# Patient Record
Sex: Male | Born: 1990 | Race: White | Hispanic: No | Marital: Single | Smoking: Current every day smoker
Health system: Southern US, Community
[De-identification: ages and names within clinical notes are randomized; demographics above are authoritative.]

## PROBLEM LIST (undated history)

## (undated) DIAGNOSIS — S36113A Laceration of liver, unspecified degree, initial encounter: Secondary | ICD-10-CM

## (undated) DIAGNOSIS — N19 Unspecified kidney failure: Secondary | ICD-10-CM

## (undated) HISTORY — PX: COLOSTOMY REVERSAL: SHX5782

## (undated) HISTORY — PX: PELVIC FRACTURE SURGERY: SHX119

## (undated) HISTORY — PX: COLON SURGERY: SHX602

## (undated) HISTORY — PX: COLOSTOMY: SHX63

---

## 2013-09-02 ENCOUNTER — Emergency Department (HOSPITAL_BASED_OUTPATIENT_CLINIC_OR_DEPARTMENT_OTHER)
Admission: EM | Admit: 2013-09-02 | Discharge: 2013-09-02 | Disposition: A | Discharge status: Home / Self Care | Payer: Managed Care, Other (non HMO) | Attending: Emergency Medicine | Admitting: Emergency Medicine

## 2013-09-02 ENCOUNTER — Encounter (HOSPITAL_BASED_OUTPATIENT_CLINIC_OR_DEPARTMENT_OTHER): Payer: Self-pay | Admitting: Emergency Medicine

## 2013-09-02 DIAGNOSIS — IMO0002 Reserved for concepts with insufficient information to code with codable children: Secondary | ICD-10-CM | POA: Insufficient documentation

## 2013-09-02 DIAGNOSIS — Y833 Surgical operation with formation of external stoma as the cause of abnormal reaction of the patient, or of later complication, without mention of misadventure at the time of the procedure: Secondary | ICD-10-CM | POA: Insufficient documentation

## 2013-09-02 DIAGNOSIS — F17200 Nicotine dependence, unspecified, uncomplicated: Secondary | ICD-10-CM | POA: Insufficient documentation

## 2013-09-02 DIAGNOSIS — T888XXA Other specified complications of surgical and medical care, not elsewhere classified, initial encounter: Secondary | ICD-10-CM

## 2013-09-02 DIAGNOSIS — Z87448 Personal history of other diseases of urinary system: Secondary | ICD-10-CM | POA: Insufficient documentation

## 2013-09-02 HISTORY — DX: Unspecified kidney failure: N19

## 2013-09-02 HISTORY — DX: Laceration of liver, unspecified degree, initial encounter: S36.113A

## 2013-09-02 NOTE — ED Provider Notes (Signed)
  Medical screening examination/treatment/procedure(s) were performed by non-physician practitioner and as supervising physician I was immediately available for consultation/collaboration.   Gerhard Munch, MD 09/02/13 1538
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
## 2013-09-02 NOTE — ED Notes (Addendum)
Pt post op colostomy reversal approximately 3 weeks ago.  Pt continues to have bleeding at site.  Pt changes pads 2 x days.  Pt has wound to lower abdomen, noted small hematoma and bloody drainage to 4x4's.  No fever or pain, no issues with bms

## 2013-09-14 ENCOUNTER — Emergency Department (HOSPITAL_BASED_OUTPATIENT_CLINIC_OR_DEPARTMENT_OTHER): Payer: Managed Care, Other (non HMO)

## 2013-09-14 ENCOUNTER — Other Ambulatory Visit: Payer: Self-pay | Admitting: Radiology

## 2013-09-14 ENCOUNTER — Encounter (HOSPITAL_BASED_OUTPATIENT_CLINIC_OR_DEPARTMENT_OTHER): Payer: Self-pay | Admitting: *Deleted

## 2013-09-14 ENCOUNTER — Other Ambulatory Visit (INDEPENDENT_AMBULATORY_CARE_PROVIDER_SITE_OTHER): Payer: Self-pay | Admitting: General Surgery

## 2013-09-14 ENCOUNTER — Emergency Department (HOSPITAL_BASED_OUTPATIENT_CLINIC_OR_DEPARTMENT_OTHER)
Admission: EM | Admit: 2013-09-14 | Discharge: 2013-09-14 | Disposition: A | Discharge status: Home / Self Care | Payer: Managed Care, Other (non HMO) | Attending: Emergency Medicine | Admitting: Emergency Medicine

## 2013-09-14 DIAGNOSIS — Z87828 Personal history of other (healed) physical injury and trauma: Secondary | ICD-10-CM | POA: Insufficient documentation

## 2013-09-14 DIAGNOSIS — IMO0002 Reserved for concepts with insufficient information to code with codable children: Secondary | ICD-10-CM | POA: Insufficient documentation

## 2013-09-14 DIAGNOSIS — Z87448 Personal history of other diseases of urinary system: Secondary | ICD-10-CM | POA: Insufficient documentation

## 2013-09-14 DIAGNOSIS — F17200 Nicotine dependence, unspecified, uncomplicated: Secondary | ICD-10-CM | POA: Insufficient documentation

## 2013-09-14 DIAGNOSIS — Y838 Other surgical procedures as the cause of abnormal reaction of the patient, or of later complication, without mention of misadventure at the time of the procedure: Secondary | ICD-10-CM | POA: Insufficient documentation

## 2013-09-14 LAB — CBC WITH DIFFERENTIAL/PLATELET
Basophils Absolute: 0 10*3/uL (ref 0.0–0.1)
Eosinophils Absolute: 0.1 10*3/uL (ref 0.0–0.7)
Eosinophils Relative: 1 % (ref 0–5)
Lymphocytes Relative: 17 % (ref 12–46)
MCH: 28.8 pg (ref 26.0–34.0)
MCV: 89 fL (ref 78.0–100.0)
Platelets: 334 10*3/uL (ref 150–400)
RDW: 14.7 % (ref 11.5–15.5)
WBC: 11.9 10*3/uL — ABNORMAL HIGH (ref 4.0–10.5)

## 2013-09-14 LAB — BASIC METABOLIC PANEL
Calcium: 10.5 mg/dL (ref 8.4–10.5)
GFR calc Af Amer: >90 mL/min (ref >90)
GFR calc non Af Amer: 85 mL/min — ABNORMAL LOW (ref >90)
Sodium: 138 mEq/L (ref 135–145)

## 2013-09-14 MED ORDER — IOHEXOL 300 MG/ML  SOLN
100.0000 mL | Freq: Once | INTRAMUSCULAR | Status: AC | PRN
  Administered 2013-09-14: 100 mL via INTRAVENOUS

## 2013-09-14 MED ORDER — IOHEXOL 300 MG/ML  SOLN
50.0000 mL | Freq: Once | INTRAMUSCULAR | Status: AC | PRN
  Administered 2013-09-14: 50 mL via ORAL
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
## 2013-09-14 NOTE — ED Notes (Addendum)
Patient c/o hematoma since 9/3 mid lower abd, seen inER on 9/3. Started experiencing pain at area 3 days ago & some swelling, normal bowel movements
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
## 2013-09-15 ENCOUNTER — Encounter (HOSPITAL_COMMUNITY): Payer: Self-pay

## 2013-09-15 ENCOUNTER — Ambulatory Visit (HOSPITAL_COMMUNITY)
Admit: 2013-09-15 | Discharge: 2013-09-15 | Disposition: A | Discharge status: Home / Self Care | Payer: Managed Care, Other (non HMO) | Attending: General Surgery | Admitting: General Surgery

## 2013-09-15 ENCOUNTER — Other Ambulatory Visit (INDEPENDENT_AMBULATORY_CARE_PROVIDER_SITE_OTHER): Payer: Self-pay | Admitting: General Surgery

## 2013-09-15 LAB — CBC
Platelets: 417 10*3/uL — ABNORMAL HIGH (ref 150–400)
RBC: 3.94 MIL/uL — ABNORMAL LOW (ref 4.22–5.81)
WBC: 14.7 10*3/uL — ABNORMAL HIGH (ref 4.0–10.5)

## 2013-09-15 LAB — PROTIME-INR: Prothrombin Time: 13.4 seconds (ref 11.6–15.2)

## 2013-09-15 MED ORDER — FENTANYL CITRATE 0.05 MG/ML IJ SOLN
INTRAMUSCULAR | Status: AC
  Filled 2013-09-15: qty 6

## 2013-09-15 MED ORDER — FENTANYL CITRATE 0.05 MG/ML IJ SOLN
INTRAMUSCULAR | Status: AC | PRN
  Administered 2013-09-15: 50 ug via INTRAVENOUS
  Administered 2013-09-15 (×2): 100 ug via INTRAVENOUS
  Administered 2013-09-15 (×3): 50 ug via INTRAVENOUS

## 2013-09-15 MED ORDER — MIDAZOLAM HCL 2 MG/2ML IJ SOLN
INTRAMUSCULAR | Status: AC
  Filled 2013-09-15: qty 6

## 2013-09-15 MED ORDER — MIDAZOLAM HCL 2 MG/2ML IJ SOLN
INTRAMUSCULAR | Status: AC
  Filled 2013-09-15: qty 4

## 2013-09-15 MED ORDER — MIDAZOLAM HCL 2 MG/2ML IJ SOLN
INTRAMUSCULAR | Status: AC | PRN
  Administered 2013-09-15 (×7): 1 mg via INTRAVENOUS

## 2013-09-15 MED ORDER — SODIUM CHLORIDE 0.9 % IV SOLN
INTRAVENOUS | Status: DC
  Administered 2013-09-15: 08:00:00 via INTRAVENOUS

## 2013-09-15 MED ORDER — MIDAZOLAM HCL 2 MG/2ML IJ SOLN
INTRAMUSCULAR | Status: DC | PRN
  Administered 2013-09-15: 1 mg via INTRAVENOUS

## 2013-09-15 MED ORDER — FENTANYL CITRATE 0.05 MG/ML IJ SOLN
INTRAMUSCULAR | Status: AC
  Filled 2013-09-15: qty 4
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
## 2013-09-15 NOTE — Procedures (Signed)
Technically successful CT guided aspiration of large hematoma within the mid abdomen and pelvis with placement of 2 temporary drains. Approximately 180 cc of viscous/old blood was aspirated from the more superior drain. Approximately 30 cc of viscous/old blood was aspirated from the lower abdominal drain. Samples sent to laboratory.  No immediate post procedural complications.

## 2013-09-15 NOTE — Progress Notes (Addendum)
Patient arrived post procedure with 2 bulb drains. Dr. Grace Isaac removed drains at 1125. Combined output of 30 mL of blood. Patient arrived with dressing over hematoma on lower mid abdomen. Dressing was replaced by Barbee Cough. From radiology per Dr. Grace Isaac. Dressings clean, dry and intact. Vitals not obtained at 3rd 30 minute increment (1120) due to drain removal.

## 2013-09-15 NOTE — ED Provider Notes (Signed)
Medical screening examination/treatment/procedure(s) were performed by non-physician practitioner and as supervising physician I was immediately available for consultation/collaboration.   Loren Racer, MD 09/15/13 1048

## 2013-09-21 ENCOUNTER — Encounter (INDEPENDENT_AMBULATORY_CARE_PROVIDER_SITE_OTHER): Payer: Self-pay | Admitting: Surgery

## 2013-09-21 LAB — CULTURE, ROUTINE-ABSCESS

## 2013-10-23 IMAGING — CT CT PELVIS W/O CM
2 of 4 series · 16 of 46 positions shown, 18 images · non-contrast
Comparison: None.

CLINICAL DATA: History of colostomy reversal 08/07/2013. Recent
drainage tube removal. Abdominal pain and distention.

EXAM:
CT PELVIS WITHOUT CONTRAST
TECHNIQUE: Multidetector CT imaging of the pelvis was performed following the
standard protocol without intravenous contrast.

[Series 2: abd/pelvis 5.0 b31f · axial · 0.65mm/px · z∈[+1105,+1330]mm · 13 of 51 slices shown, 15 images]
[im 3/51  soft-tissue]
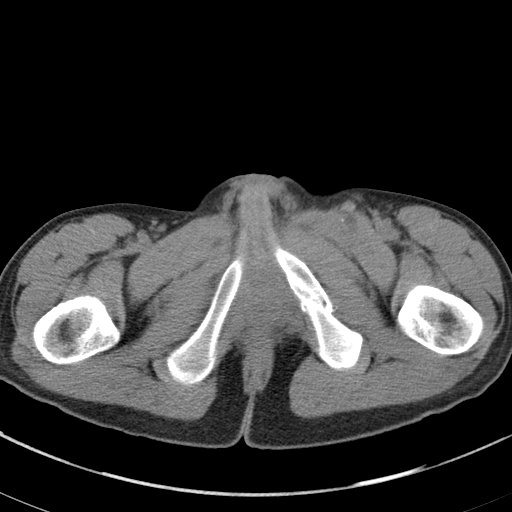
[im 3/51  bone]
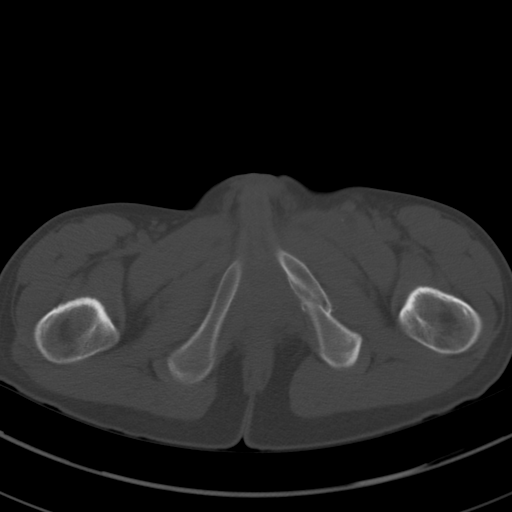
[im 7/51  soft-tissue]
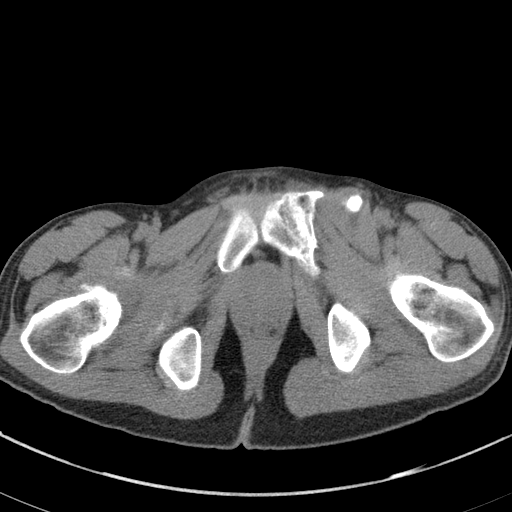
[im 11/51  soft-tissue]
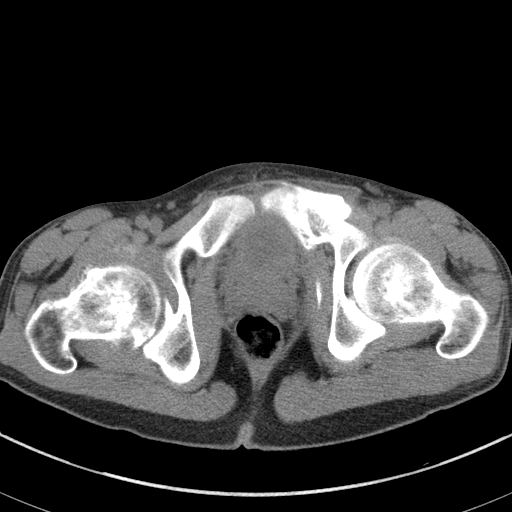
[im 15/51  soft-tissue]
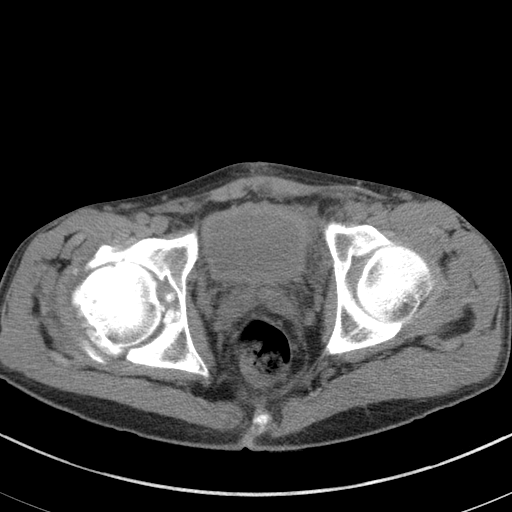
[im 17/51  soft-tissue]
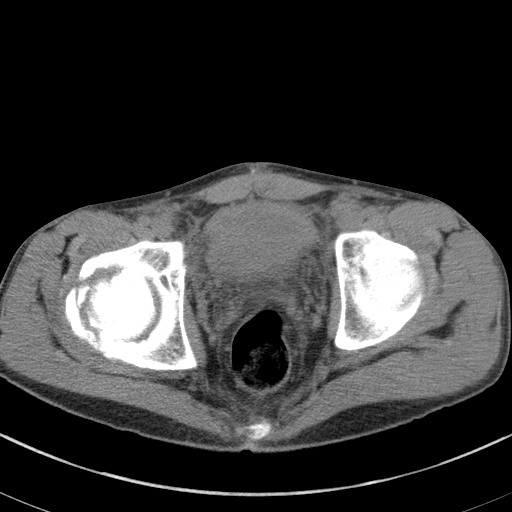
[im 21/51  soft-tissue]
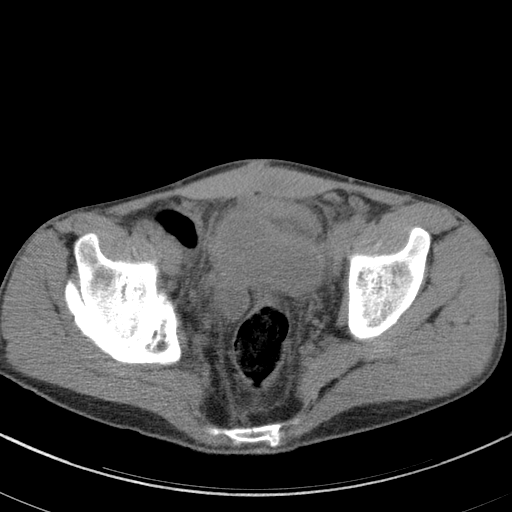
[im 26/51  soft-tissue]
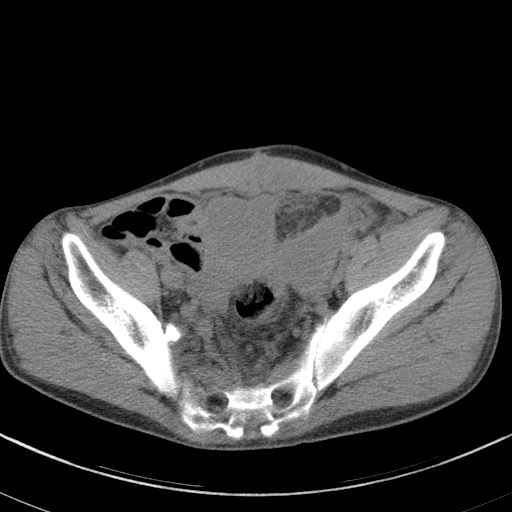
[im 30/51  soft-tissue]
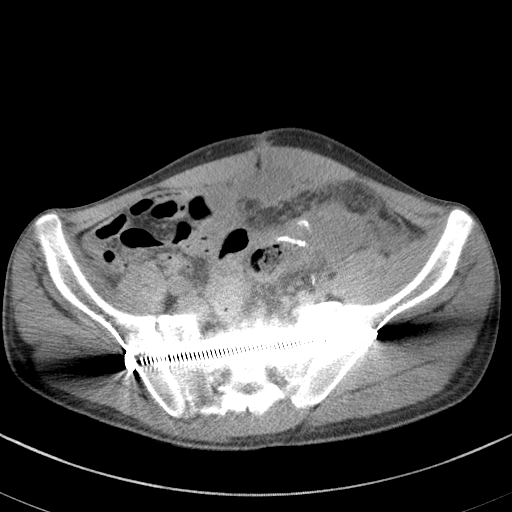
[im 34/51  soft-tissue]
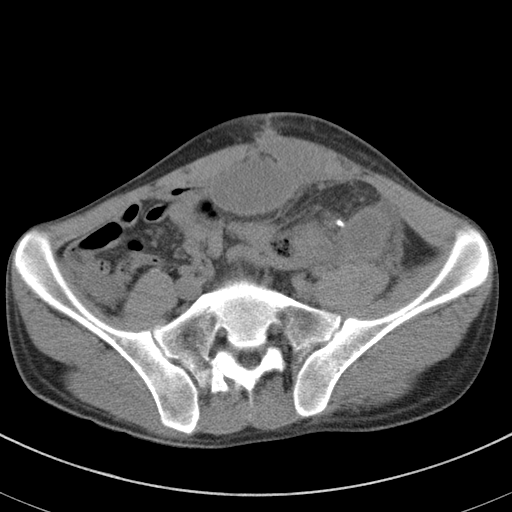
[im 34/51  bone]
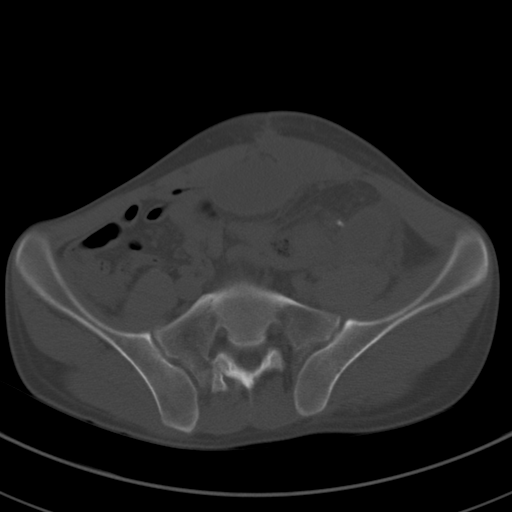
[im 36/51  soft-tissue]
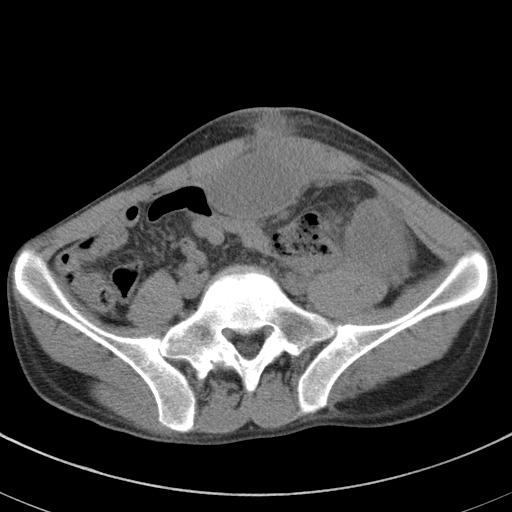
[im 40/51  soft-tissue]
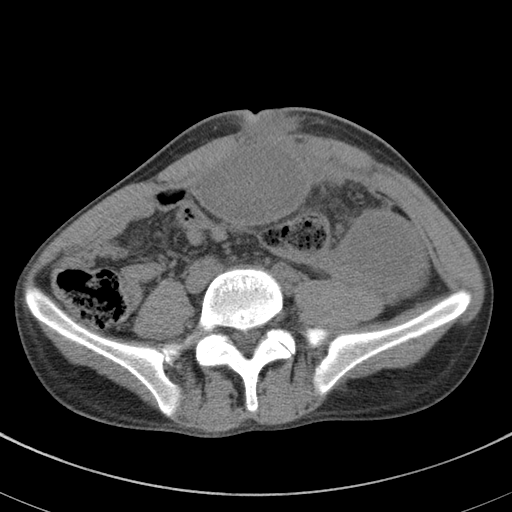
[im 44/51  soft-tissue]
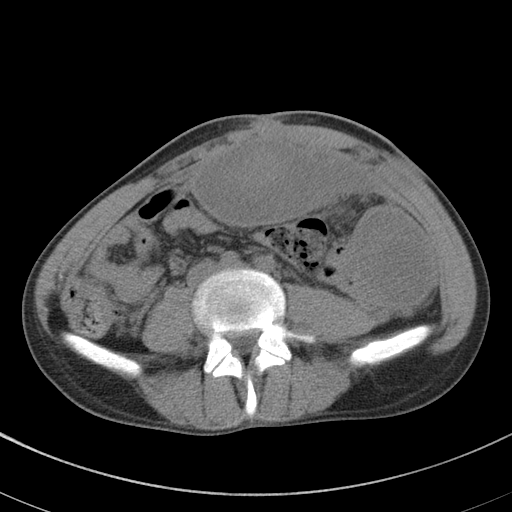
[im 48/51  soft-tissue]
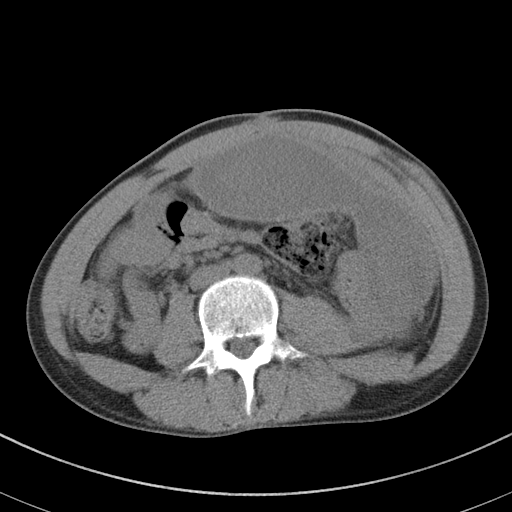

[Series 5: abd/pelvis 3.0 coronal · coronal · 0.50mm/px · 3 of 74 slices shown]
[im 25/74  soft-tissue]
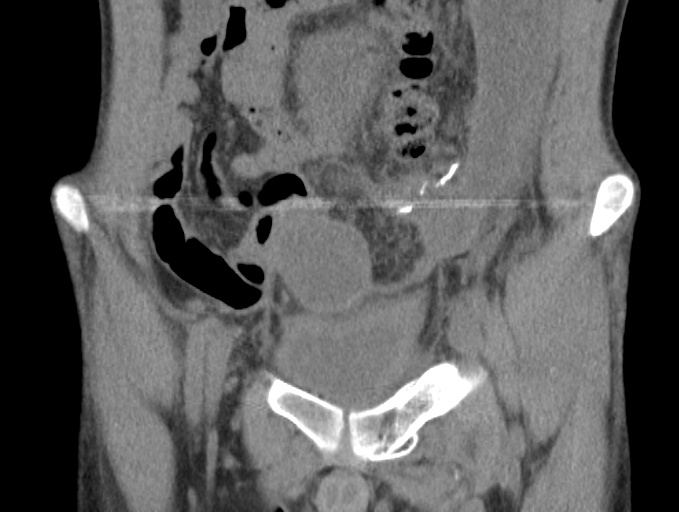
[im 33/74  soft-tissue]
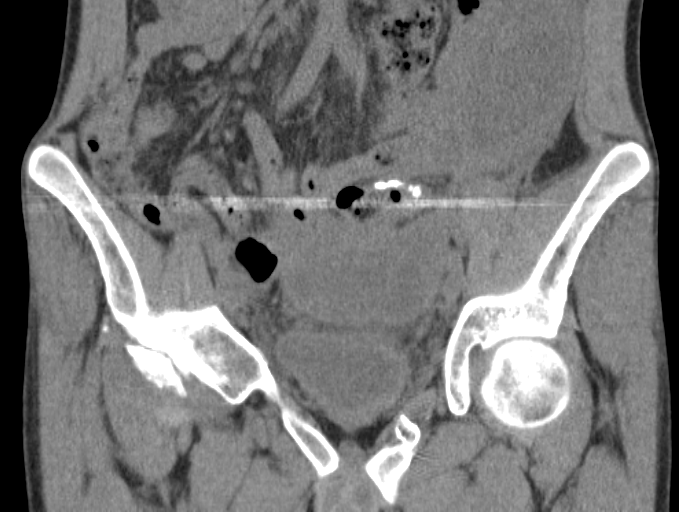
[im 41/74  soft-tissue]
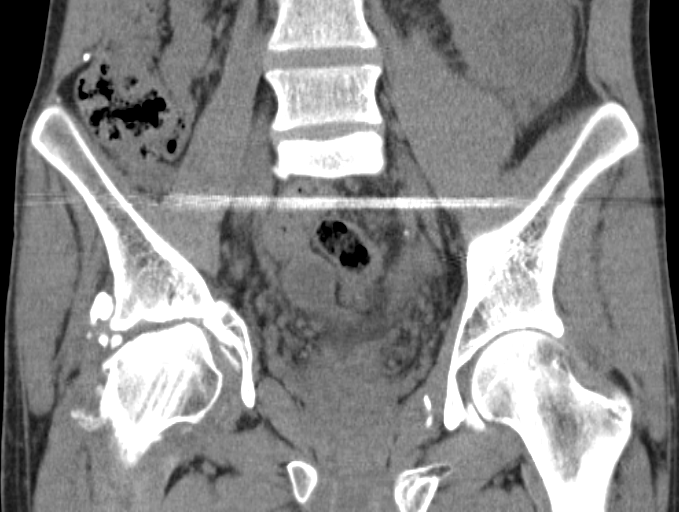

[16 of 46 positions shown; findings below may reference images not displayed]

FINDINGS: Examination is limited without IV and oral contrast and does not
included the abdomen.

There is a large slightly complex fluid collection involving the
pelvis an lower abdomen. The superior extent is not visualized. This
could be a liquified hematoma, abscess or a large postoperative
seroma. It extends above the emboli kiss and may communicate with a
small subcutaneous fluid collection along the left side of the
emboli kiss. It extends down on the left side of the pelvis adjacent
to the anastomosis sutures and is just above the bladder. The colon
is grossly normal.

Remote postoperative changes involving the pelvis related to
previous pelvic and right hip fractures. Severe degenerative changes
and probable remote AVN involving the right femoral head. Loose
fracture fragments are noted in the right hip joint.
IMPRESSION: At least 17 x 15 cm fluid collection in the lower abdomen and pelvis
which could represent a liquified hematoma, abscess or postoperative
seroma. Exam limited without IV and oral contrast. The superior
extent of this fluid collection is not identified.
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
## 2016-09-01 ENCOUNTER — Emergency Department (HOSPITAL_BASED_OUTPATIENT_CLINIC_OR_DEPARTMENT_OTHER): Payer: Self-pay

## 2016-09-01 ENCOUNTER — Inpatient Hospital Stay (HOSPITAL_BASED_OUTPATIENT_CLINIC_OR_DEPARTMENT_OTHER)
Admission: EM | Admit: 2016-09-01 | Discharge: 2016-09-03 | DRG: 392 | Disposition: A | Discharge status: Home / Self Care | Payer: Self-pay | Attending: Internal Medicine | Admitting: Internal Medicine

## 2016-09-01 ENCOUNTER — Encounter (HOSPITAL_BASED_OUTPATIENT_CLINIC_OR_DEPARTMENT_OTHER): Payer: Self-pay | Admitting: Emergency Medicine

## 2016-09-01 DIAGNOSIS — D72829 Elevated white blood cell count, unspecified: Secondary | ICD-10-CM

## 2016-09-01 DIAGNOSIS — Z23 Encounter for immunization: Secondary | ICD-10-CM

## 2016-09-01 DIAGNOSIS — K922 Gastrointestinal hemorrhage, unspecified: Secondary | ICD-10-CM

## 2016-09-01 DIAGNOSIS — E871 Hypo-osmolality and hyponatremia: Secondary | ICD-10-CM | POA: Diagnosis present

## 2016-09-01 DIAGNOSIS — E861 Hypovolemia: Secondary | ICD-10-CM | POA: Diagnosis present

## 2016-09-01 DIAGNOSIS — A09 Infectious gastroenteritis and colitis, unspecified: Principal | ICD-10-CM | POA: Diagnosis present

## 2016-09-01 DIAGNOSIS — Z800 Family history of malignant neoplasm of digestive organs: Secondary | ICD-10-CM

## 2016-09-01 DIAGNOSIS — B9620 Unspecified Escherichia coli [E. coli] as the cause of diseases classified elsewhere: Secondary | ICD-10-CM | POA: Diagnosis present

## 2016-09-01 DIAGNOSIS — F17210 Nicotine dependence, cigarettes, uncomplicated: Secondary | ICD-10-CM | POA: Diagnosis present

## 2016-09-01 DIAGNOSIS — K529 Noninfective gastroenteritis and colitis, unspecified: Secondary | ICD-10-CM | POA: Diagnosis present

## 2016-09-01 LAB — CBC WITH DIFFERENTIAL/PLATELET
BAND NEUTROPHILS: 5 %
BASOS PCT: 0 %
Basophils Absolute: 0 10*3/uL (ref 0.0–0.1)
Eosinophils Absolute: 0.2 10*3/uL (ref 0.0–0.7)
Eosinophils Relative: 1 %
HEMATOCRIT: 43.7 % (ref 39.0–52.0)
HEMOGLOBIN: 15.3 g/dL (ref 13.0–17.0)
Lymphocytes Relative: 9 %
Lymphs Abs: 2.1 10*3/uL (ref 0.7–4.0)
MCH: 33.1 pg (ref 26.0–34.0)
MCHC: 35 g/dL (ref 30.0–36.0)
MCV: 94.6 fL (ref 78.0–100.0)
MONO ABS: 3.5 10*3/uL — AB (ref 0.1–1.0)
Monocytes Relative: 15 %
Neutro Abs: 17.5 10*3/uL — ABNORMAL HIGH (ref 1.7–7.7)
Neutrophils Relative %: 70 %
Platelets: 155 10*3/uL (ref 150–400)
RBC: 4.62 MIL/uL (ref 4.22–5.81)
RDW: 13.4 % (ref 11.5–15.5)
WBC: 23.3 10*3/uL — ABNORMAL HIGH (ref 4.0–10.5)

## 2016-09-01 LAB — COMPREHENSIVE METABOLIC PANEL
ALK PHOS: 95 U/L (ref 38–126)
ALT: 52 U/L (ref 17–63)
AST: 33 U/L (ref 15–41)
Albumin: 4.3 g/dL (ref 3.5–5.0)
Anion gap: 10 (ref 5–15)
BILIRUBIN TOTAL: 0.8 mg/dL (ref 0.3–1.2)
BUN: 12 mg/dL (ref 6–20)
CO2: 24 mmol/L (ref 22–32)
Calcium: 9.2 mg/dL (ref 8.9–10.3)
Chloride: 98 mmol/L — ABNORMAL LOW (ref 101–111)
Creatinine, Ser: 1.13 mg/dL (ref 0.61–1.24)
GFR calc Af Amer: >60 mL/min (ref >60)
Glucose, Bld: 115 mg/dL — ABNORMAL HIGH (ref 65–99)
POTASSIUM: 3.8 mmol/L (ref 3.5–5.1)
Sodium: 132 mmol/L — ABNORMAL LOW (ref 135–145)
TOTAL PROTEIN: 7.8 g/dL (ref 6.5–8.1)

## 2016-09-01 MED ORDER — CIPROFLOXACIN IN D5W 400 MG/200ML IV SOLN
400.0000 mg | Freq: Two times a day (BID) | INTRAVENOUS | Status: DC
  Administered 2016-09-02 (×2): 400 mg via INTRAVENOUS
  Filled 2016-09-01 (×2): qty 200

## 2016-09-01 MED ORDER — IOPAMIDOL (ISOVUE-300) INJECTION 61%
100.0000 mL | Freq: Once | INTRAVENOUS | Status: AC | PRN
  Administered 2016-09-01: 100 mL via INTRAVENOUS

## 2016-09-01 MED ORDER — PIPERACILLIN-TAZOBACTAM 3.375 G IVPB 30 MIN
3.3750 g | Freq: Once | INTRAVENOUS | Status: DC
  Filled 2016-09-01 (×2): qty 50

## 2016-09-01 MED ORDER — FENTANYL CITRATE (PF) 100 MCG/2ML IJ SOLN
50.0000 ug | Freq: Once | INTRAMUSCULAR | Status: AC
  Administered 2016-09-01: 50 ug via INTRAVENOUS
  Filled 2016-09-01: qty 2

## 2016-09-01 MED ORDER — PIPERACILLIN-TAZOBACTAM 3.375 G IVPB: 3.3750 g | Freq: Three times a day (TID) | INTRAVENOUS | Status: DC

## 2016-09-01 MED ORDER — SODIUM CHLORIDE 0.9 % IV BOLUS (SEPSIS)
1000.0000 mL | Freq: Once | INTRAVENOUS | Status: AC
  Administered 2016-09-01: 1000 mL via INTRAVENOUS

## 2016-09-01 MED ORDER — LIDOCAINE HCL 2 % EX GEL
CUTANEOUS | Status: AC
  Administered 2016-09-01: 20
  Filled 2016-09-01: qty 20

## 2016-09-01 NOTE — ED Notes (Signed)
Patient transported to CT 

## 2016-09-01 NOTE — ED Notes (Signed)
MD at bedside. 

## 2016-09-01 NOTE — ED Triage Notes (Signed)
patient is very orthostatic in triage. Reports that he is very dizzy with standing
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
## 2016-09-01 NOTE — ED Triage Notes (Signed)
Patient states that he has had generalized aches and pains  - today started to have loose bowel movements that consist of only blood.

## 2016-09-02 ENCOUNTER — Encounter (HOSPITAL_COMMUNITY): Payer: Self-pay | Admitting: Family Medicine

## 2016-09-02 DIAGNOSIS — E871 Hypo-osmolality and hyponatremia: Secondary | ICD-10-CM | POA: Diagnosis present

## 2016-09-02 DIAGNOSIS — K529 Noninfective gastroenteritis and colitis, unspecified: Secondary | ICD-10-CM

## 2016-09-02 LAB — GASTROINTESTINAL PANEL BY PCR, STOOL (REPLACES STOOL CULTURE)
ADENOVIRUS F40/41: NOT DETECTED
ASTROVIRUS: NOT DETECTED
CAMPYLOBACTER SPECIES: NOT DETECTED
Cryptosporidium: NOT DETECTED
Cyclospora cayetanensis: NOT DETECTED
ENTEROAGGREGATIVE E COLI (EAEC): NOT DETECTED
ENTEROPATHOGENIC E COLI (EPEC): DETECTED — AB
ENTEROTOXIGENIC E COLI (ETEC): NOT DETECTED
Entamoeba histolytica: NOT DETECTED
GIARDIA LAMBLIA: NOT DETECTED
NOROVIRUS GI/GII: NOT DETECTED
PLESIMONAS SHIGELLOIDES: NOT DETECTED
Rotavirus A: NOT DETECTED
Salmonella species: NOT DETECTED
Sapovirus (I, II, IV, and V): NOT DETECTED
Shiga like toxin producing E coli (STEC): NOT DETECTED
Shigella/Enteroinvasive E coli (EIEC): DETECTED — AB
Vibrio cholerae: NOT DETECTED
Vibrio species: NOT DETECTED
Yersinia enterocolitica: NOT DETECTED

## 2016-09-02 LAB — CBC
HEMATOCRIT: 41.1 % (ref 39.0–52.0)
Hemoglobin: 13.6 g/dL (ref 13.0–17.0)
MCH: 31.6 pg (ref 26.0–34.0)
MCHC: 33.1 g/dL (ref 30.0–36.0)
MCV: 95.6 fL (ref 78.0–100.0)
Platelets: 133 10*3/uL — ABNORMAL LOW (ref 150–400)
RBC: 4.3 MIL/uL (ref 4.22–5.81)
RDW: 13.7 % (ref 11.5–15.5)
WBC: 14.2 10*3/uL — AB (ref 4.0–10.5)

## 2016-09-02 LAB — BASIC METABOLIC PANEL
ANION GAP: 9 (ref 5–15)
BUN: 10 mg/dL (ref 6–20)
CO2: 23 mmol/L (ref 22–32)
Calcium: 8.8 mg/dL — ABNORMAL LOW (ref 8.9–10.3)
Chloride: 102 mmol/L (ref 101–111)
Creatinine, Ser: 0.93 mg/dL (ref 0.61–1.24)
GFR calc Af Amer: >60 mL/min (ref >60)
GFR calc non Af Amer: >60 mL/min (ref >60)
GLUCOSE: 96 mg/dL (ref 65–99)
POTASSIUM: 3.7 mmol/L (ref 3.5–5.1)
Sodium: 134 mmol/L — ABNORMAL LOW (ref 135–145)

## 2016-09-02 LAB — SEDIMENTATION RATE: Sed Rate: 16 mm/hr (ref 0–16)

## 2016-09-02 LAB — C DIFFICILE QUICK SCREEN W PCR REFLEX
C Diff antigen: NEGATIVE
C Diff interpretation: NOT DETECTED
C Diff toxin: NEGATIVE

## 2016-09-02 LAB — C-REACTIVE PROTEIN: CRP: 15.4 mg/dL — ABNORMAL HIGH (ref <1.0)

## 2016-09-02 MED ORDER — DEXTROSE 5 % IV SOLN
2.0000 g | INTRAVENOUS | Status: DC
  Administered 2016-09-02: 2 g via INTRAVENOUS
  Filled 2016-09-02 (×2): qty 2

## 2016-09-02 MED ORDER — INFLUENZA VAC SPLIT QUAD 0.5 ML IM SUSY: 0.5000 mL | PREFILLED_SYRINGE | INTRAMUSCULAR | Status: DC

## 2016-09-02 MED ORDER — OXYCODONE HCL 5 MG PO TABS
5.0000 mg | ORAL_TABLET | ORAL | Status: DC | PRN
  Administered 2016-09-02 (×4): 5 mg via ORAL
  Filled 2016-09-02 (×4): qty 1

## 2016-09-02 MED ORDER — POTASSIUM CHLORIDE IN NACL 20-0.9 MEQ/L-% IV SOLN
INTRAVENOUS | Status: DC
  Administered 2016-09-02 – 2016-09-03 (×2): via INTRAVENOUS
  Filled 2016-09-02 (×4): qty 1000

## 2016-09-02 MED ORDER — ONDANSETRON HCL 4 MG/2ML IJ SOLN: 4.0000 mg | Freq: Four times a day (QID) | INTRAMUSCULAR | Status: DC | PRN

## 2016-09-02 MED ORDER — ONDANSETRON HCL 4 MG PO TABS: 4.0000 mg | ORAL_TABLET | Freq: Four times a day (QID) | ORAL | Status: DC | PRN

## 2016-09-02 MED ORDER — ACETAMINOPHEN 325 MG PO TABS: 650.0000 mg | ORAL_TABLET | Freq: Four times a day (QID) | ORAL | Status: DC | PRN

## 2016-09-02 MED ORDER — ACETAMINOPHEN 650 MG RE SUPP: 650.0000 mg | Freq: Four times a day (QID) | RECTAL | Status: DC | PRN

## 2016-09-02 MED ORDER — METRONIDAZOLE IN NACL 5-0.79 MG/ML-% IV SOLN
500.0000 mg | Freq: Three times a day (TID) | INTRAVENOUS | Status: DC
  Administered 2016-09-02 (×2): 500 mg via INTRAVENOUS
  Filled 2016-09-02 (×2): qty 100

## 2016-09-02 NOTE — Progress Notes (Signed)
Patient with positive pathogenic stool results. Dr. Randol KernElgergawy MD aware.  Avelina LaineKimberly Jamal Haskin RN
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
## 2016-09-02 NOTE — Progress Notes (Signed)
New Admission Note:  Arrival Method: Stretcher via [facility] Mental Orientation: A&O x4 Telemetry: N/A Assessment: Completed Skin: Completed with Estrella Myrtleheryl M, RN, check flowsheets IV: L. Forearm, clean, dry and intact. Pain: 6/10, awaiting MD to place orders. Tubes: N/A Safety Measures: Safety Fall Prevention Plan discussed with patient.  Admission: Completed [address] Orientation: Patient has been orientated to the room, unit and the staff. Family: N/A  Orders have been reviewed and implemented. Will continue to monitor the patient. Call light has been placed within reach.  Rivka BarbaraZenab Wilburt Messina BSN, RN  Phone Number: [phone]
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
## 2016-09-03 DIAGNOSIS — E871 Hypo-osmolality and hyponatremia: Secondary | ICD-10-CM

## 2016-09-03 LAB — CBC
HEMATOCRIT: 39 % (ref 39.0–52.0)
Hemoglobin: 12.6 g/dL — ABNORMAL LOW (ref 13.0–17.0)
MCH: 31.6 pg (ref 26.0–34.0)
MCHC: 32.3 g/dL (ref 30.0–36.0)
MCV: 97.7 fL (ref 78.0–100.0)
PLATELETS: 131 10*3/uL — AB (ref 150–400)
RBC: 3.99 MIL/uL — ABNORMAL LOW (ref 4.22–5.81)
RDW: 13.7 % (ref 11.5–15.5)
WBC: 7.5 10*3/uL (ref 4.0–10.5)

## 2016-09-03 LAB — BASIC METABOLIC PANEL
ANION GAP: 4 — AB (ref 5–15)
BUN: 7 mg/dL (ref 6–20)
CALCIUM: 9 mg/dL (ref 8.9–10.3)
CO2: 29 mmol/L (ref 22–32)
Chloride: 108 mmol/L (ref 101–111)
Creatinine, Ser: 0.9 mg/dL (ref 0.61–1.24)
GFR calc Af Amer: >60 mL/min (ref >60)
GFR calc non Af Amer: >60 mL/min (ref >60)
GLUCOSE: 113 mg/dL — AB (ref 65–99)
Potassium: 4.1 mmol/L (ref 3.5–5.1)
Sodium: 141 mmol/L (ref 135–145)

## 2016-09-03 MED ORDER — DEXTROSE 5 % IV SOLN
2.0000 g | INTRAVENOUS | Status: DC
  Administered 2016-09-03: 2 g via INTRAVENOUS
  Filled 2016-09-03: qty 2

## 2016-09-03 MED ORDER — CIPROFLOXACIN HCL 500 MG PO TABS: 500.0000 mg | ORAL_TABLET | Freq: Two times a day (BID) | ORAL | 0 refills | Status: AC
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
## 2016-09-03 NOTE — Discharge Instructions (Signed)
Follow with Primary MD in 7 days   Get CBC, CMP,  checked  by Primary MD next visit.    Activity: As tolerated    Disposition Home    Diet: Regular diet, with feeding assistance and aspiration precautions.  For Heart failure patients - Check your Weight same time everyday, if you gain over 2 pounds, or you develop in leg swelling, experience more shortness of breath or chest pain, call your Primary MD immediately. Follow Cardiac Low Salt Diet and 1.5 lit/day fluid restriction.   On your next visit with your primary care physician please Get Medicines reviewed and adjusted.   Please request your Prim.MD to go over all Hospital Tests and Procedure/Radiological results at the follow up, please get all Hospital records sent to your Prim MD by signing hospital release before you go home.   If you experience worsening of your admission symptoms, develop shortness of breath, life threatening emergency, suicidal or homicidal thoughts you must seek medical attention immediately by calling 911 or calling your MD immediately  if symptoms less severe.  You Must read complete instructions/literature along with all the possible adverse reactions/side effects for all the Medicines you take and that have been prescribed to you. Take any new Medicines after you have completely understood and accpet all the possible adverse reactions/side effects.   Do not drive, operating heavy machinery, perform activities at heights, swimming or participation in water activities or provide baby sitting services if your were admitted for syncope or siezures until you have seen by Primary MD or a Neurologist and advised to do so again.  Do not drive when taking Pain medications.    Do not take more than prescribed Pain, Sleep and Anxiety Medications  Special Instructions: If you have smoked or chewed Tobacco  in the last 2 yrs please stop smoking, stop any regular Alcohol  and or any Recreational drug use.  Wear  Seat belts while driving.   Please note  You were cared for by a hospitalist during your hospital stay. If you have any questions about your discharge medications or the care you received while you were in the hospital after you are discharged, you can call the unit and asked to speak with the hospitalist on call if the hospitalist that took care of you is not available. Once you are discharged, your primary care physician will handle any further medical issues. Please note that NO REFILLS for any discharge medications will be authorized once you are discharged, as it is imperative that you return to your primary care physician (or establish a relationship with a primary care physician if you do not have one) for your aftercare needs so that they can reassess your need for medications and monitor your lab values.

## 2016-11-20 DEATH — deceased
